# Patient Record
Sex: Male | Born: 1965 | Race: White | Hispanic: No | Marital: Married | Smoking: Never smoker
Health system: Southern US, Community
[De-identification: ages and names within clinical notes are randomized; demographics above are authoritative.]

## PROBLEM LIST (undated history)

## (undated) HISTORY — PX: CHOLECYSTECTOMY: SHX55

---

## 2018-08-01 ENCOUNTER — Emergency Department (HOSPITAL_BASED_OUTPATIENT_CLINIC_OR_DEPARTMENT_OTHER): Payer: Worker's Compensation

## 2018-08-01 ENCOUNTER — Emergency Department (HOSPITAL_BASED_OUTPATIENT_CLINIC_OR_DEPARTMENT_OTHER)
Admission: EM | Admit: 2018-08-01 | Discharge: 2018-08-01 | Disposition: A | Discharge status: Home / Self Care | Payer: Worker's Compensation | Attending: Emergency Medicine | Admitting: Emergency Medicine

## 2018-08-01 ENCOUNTER — Other Ambulatory Visit: Payer: Self-pay

## 2018-08-01 ENCOUNTER — Encounter (HOSPITAL_BASED_OUTPATIENT_CLINIC_OR_DEPARTMENT_OTHER): Payer: Self-pay

## 2018-08-01 DIAGNOSIS — I10 Essential (primary) hypertension: Secondary | ICD-10-CM | POA: Insufficient documentation

## 2018-08-01 DIAGNOSIS — Y999 Unspecified external cause status: Secondary | ICD-10-CM | POA: Insufficient documentation

## 2018-08-01 DIAGNOSIS — W19XXXA Unspecified fall, initial encounter: Secondary | ICD-10-CM

## 2018-08-01 DIAGNOSIS — Y939 Activity, unspecified: Secondary | ICD-10-CM | POA: Insufficient documentation

## 2018-08-01 DIAGNOSIS — M545 Low back pain, unspecified: Secondary | ICD-10-CM

## 2018-08-01 DIAGNOSIS — K7460 Unspecified cirrhosis of liver: Secondary | ICD-10-CM | POA: Diagnosis not present

## 2018-08-01 DIAGNOSIS — S20212A Contusion of left front wall of thorax, initial encounter: Secondary | ICD-10-CM

## 2018-08-01 DIAGNOSIS — S299XXA Unspecified injury of thorax, initial encounter: Secondary | ICD-10-CM | POA: Diagnosis present

## 2018-08-01 DIAGNOSIS — W01190A Fall on same level from slipping, tripping and stumbling with subsequent striking against furniture, initial encounter: Secondary | ICD-10-CM | POA: Insufficient documentation

## 2018-08-01 DIAGNOSIS — Z79899 Other long term (current) drug therapy: Secondary | ICD-10-CM | POA: Insufficient documentation

## 2018-08-01 DIAGNOSIS — Y929 Unspecified place or not applicable: Secondary | ICD-10-CM | POA: Diagnosis not present

## 2018-08-01 LAB — CBC WITH DIFFERENTIAL/PLATELET
Abs Immature Granulocytes: 0.05 10*3/uL (ref 0.00–0.07)
Basophils Absolute: 0.1 10*3/uL (ref 0.0–0.1)
Basophils Relative: 1 %
EOS PCT: 4 %
Eosinophils Absolute: 0.3 10*3/uL (ref 0.0–0.5)
HCT: 46.9 % (ref 39.0–52.0)
Hemoglobin: 15.1 g/dL (ref 13.0–17.0)
Immature Granulocytes: 1 %
Lymphocytes Relative: 33 %
Lymphs Abs: 2.3 10*3/uL (ref 0.7–4.0)
MCH: 31.9 pg (ref 26.0–34.0)
MCHC: 32.2 g/dL (ref 30.0–36.0)
MCV: 99.2 fL (ref 80.0–100.0)
MONO ABS: 0.8 10*3/uL (ref 0.1–1.0)
MONOS PCT: 12 %
Neutro Abs: 3.6 10*3/uL (ref 1.7–7.7)
Neutrophils Relative %: 49 %
Platelets: 204 10*3/uL (ref 150–400)
RBC: 4.73 MIL/uL (ref 4.22–5.81)
RDW: 12.6 % (ref 11.5–15.5)
WBC: 7.1 10*3/uL (ref 4.0–10.5)
nRBC: 0 % (ref 0.0–0.2)

## 2018-08-01 LAB — COMPREHENSIVE METABOLIC PANEL
ALT: 49 U/L — ABNORMAL HIGH (ref 0–44)
AST: 82 U/L — ABNORMAL HIGH (ref 15–41)
Albumin: 4 g/dL (ref 3.5–5.0)
Alkaline Phosphatase: 116 U/L (ref 38–126)
Anion gap: 9 (ref 5–15)
BUN: 13 mg/dL (ref 6–20)
CO2: 26 mmol/L (ref 22–32)
CREATININE: 1.02 mg/dL (ref 0.61–1.24)
Calcium: 9.4 mg/dL (ref 8.9–10.3)
Chloride: 105 mmol/L (ref 98–111)
GFR calc Af Amer: >60 mL/min (ref >60)
GFR calc non Af Amer: >60 mL/min (ref >60)
Glucose, Bld: 169 mg/dL — ABNORMAL HIGH (ref 70–99)
Potassium: 3.7 mmol/L (ref 3.5–5.1)
Sodium: 140 mmol/L (ref 135–145)
Total Bilirubin: 1 mg/dL (ref 0.3–1.2)
Total Protein: 7.9 g/dL (ref 6.5–8.1)

## 2018-08-01 MED ORDER — HYDRALAZINE HCL 20 MG/ML IJ SOLN
5.0000 mg | Freq: Once | INTRAMUSCULAR | Status: AC
  Administered 2018-08-01: 5 mg via INTRAVENOUS
  Filled 2018-08-01: qty 1

## 2018-08-01 MED ORDER — CYCLOBENZAPRINE HCL 10 MG PO TABS: 10.0000 mg | ORAL_TABLET | Freq: Two times a day (BID) | ORAL | 0 refills | Status: AC | PRN

## 2018-08-01 MED ORDER — IOPAMIDOL (ISOVUE-300) INJECTION 61%
100.0000 mL | Freq: Once | INTRAVENOUS | Status: AC | PRN
  Administered 2018-08-01: 100 mL via INTRAVENOUS

## 2018-08-01 MED ORDER — ACETAMINOPHEN 500 MG PO TABS
1000.0000 mg | ORAL_TABLET | Freq: Once | ORAL | Status: AC
  Administered 2018-08-01: 1000 mg via ORAL
  Filled 2018-08-01: qty 2

## 2018-08-01 MED ORDER — HYDROCHLOROTHIAZIDE 25 MG PO TABS: 25.0000 mg | ORAL_TABLET | Freq: Every day | ORAL | 0 refills | Status: AC

## 2018-08-01 NOTE — ED Notes (Signed)
ED Provider at bedside. 

## 2018-08-01 NOTE — ED Notes (Signed)
PT states understanding of care given, follow up care, and medication prescribed. PT ambulated from ED to car with a steady gait. 

## 2018-08-01 NOTE — ED Triage Notes (Signed)
Pt was on ladder at work yesterday- got caught and fell. Pt reports back pain- was seen at urgent care and sent here. Pt hypertensive - does not take medication.

## 2018-08-01 NOTE — ED Provider Notes (Signed)
[facility] EMERGENCY DEPARTMENT Provider Note   CSN: [id number] Arrival date & time: 08/01/18  1709     History   Chief Complaint Chief Complaint  Patient presents with  . Fall    HPI Yuepheng Schleeter is a 53 y.o. male.  HPI   2-3 steps from the top of 10 foot ladder, was reaching for something then twisting, and fell to the side. back hit the metal shelving unit and leg twisted into ladder ring, shelving unit had a metal piece that is raised up, leg was twisted into ladder step and that kept him from falling all of the way. Essentially fall from standing. Occurred 230PM yesterday.  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  Left knee with pain with walking. Lower and thoracic back.  Can feel it when trying to lean over to pick up, twist, or open door.  Severe pain.  No head trauma, no LOC, no headaches or neck pain. No numbness or weakness. Toes since yesterday tingling.  Started to get rash thinks from stress on arms.  No urinary or stool symptoms.    No abdominal pain or chest pain.   Flank pain with deep breaths RUQ and flank pain    Hx of htn, off of medication since last year  History reviewed. No pertinent past medical history.  There are no active problems to display for this patient.   Past Surgical History:  Procedure Laterality Date  . CHOLECYSTECTOMY          Home Medications    Prior to Admission medications   Medication Sig Start Date End Date Taking? Authorizing Provider  cyclobenzaprine (FLEXERIL) 10 MG tablet Take 1 tablet (10 mg total) by mouth 2 (two) times daily as needed for muscle spasms. 08/01/18   Alvira Monday, MD  hydrochlorothiazide (HYDRODIURIL) 25 MG tablet Take 1 tablet (25 mg total) by mouth daily. 08/01/18   Alvira Monday, MD    Family History No family history on file.  Social History Social History   Tobacco Use  . Smoking status: Never Smoker  . Smokeless tobacco: Never Used  Substance  Use Topics  . Alcohol use: Yes  . Drug use: Never     Allergies   Penicillins   Review of Systems Review of Systems  Constitutional: Negative for fever.  HENT: Negative for sore throat.   Eyes: Negative for visual disturbance.  Respiratory: Negative for shortness of breath.   Cardiovascular: Negative for chest pain.  Gastrointestinal: Positive for abdominal pain. Negative for constipation, diarrhea, nausea and vomiting.  Genitourinary: Positive for flank pain. Negative for difficulty urinating and dysuria.  Musculoskeletal: Positive for arthralgias and back pain. Negative for neck stiffness.  Skin: Negative for rash.  Neurological: Negative for syncope, facial asymmetry, weakness, numbness and headaches.     Physical Exam Updated Vital Signs BP (!) 166/100 (BP Location: Right Arm) Comment: Simultaneous filing. User may not have seen previous data.  Pulse 90 Comment: Simultaneous filing. User may not have seen previous data.  Temp 98.1 F (36.7 C) (Oral)   Resp 20   SpO2 96% Comment: Simultaneous filing. User may not have seen previous data.  Physical Exam Vitals signs and nursing note reviewed.  Constitutional:      General: He is not in acute distress.    Appearance: He is well-developed. He is not diaphoretic.  HENT:     Head: Normocephalic and atraumatic.  Eyes:     Conjunctiva/sclera: Conjunctivae normal.  Neck:     Musculoskeletal: Normal range of motion.  Cardiovascular:     Rate and Rhythm: Normal rate and regular rhythm.     Heart sounds: Normal heart sounds. No murmur. No friction rub. No gallop.   Pulmonary:     Effort: Pulmonary effort is normal. No respiratory distress.     Breath sounds: Normal breath sounds. No wheezing or rales.  Chest:     Chest wall: Tenderness present.  Abdominal:     General: There is no distension.     Palpations: Abdomen is soft.     Tenderness: There is abdominal tenderness. There is no guarding.  Musculoskeletal:      Lumbar back: He exhibits bony tenderness.  Skin:    General: Skin is warm and dry.  Neurological:     Mental Status: He is alert and oriented to person, place, and time.     GCS: GCS eye subscore is 4. GCS verbal subscore is 5. GCS motor subscore is 6.     Sensory: Sensation is intact.     Motor: Motor function is intact.      ED Treatments / Results  Labs (all labs ordered are listed, but only abnormal results are displayed) Labs Reviewed  COMPREHENSIVE METABOLIC PANEL - Abnormal; Notable for the following components:      Result Value   Glucose, Bld 169 (*)    AST 82 (*)    ALT 49 (*)    All other components within normal limits  CBC WITH DIFFERENTIAL/PLATELET    EKG None  Radiology Dg Chest 2 View  Result Date: 08/01/2018 CLINICAL DATA:  Back pain after fall from ladder EXAM: CHEST - 2 VIEW COMPARISON:  08/01/2018 thoracic spine radiograph. FINDINGS: The heart size and mediastinal contours are within normal limits. Both lungs are clear. The visualized skeletal structures are unremarkable. IMPRESSION: Normal chest. Electronically Signed   By: Deatra RobinsonKevin  Herman M.D.   On: 08/01/2018 22:02   Dg Thoracic Spine 2 View  Result Date: 08/01/2018 CLINICAL DATA:  Fall, right upper back pain and right flank pain EXAM: THORACIC SPINE 2 VIEWS COMPARISON:  None. FINDINGS: There is no evidence of thoracic spine fracture. Alignment is normal. No other significant bone abnormalities are identified. IMPRESSION: Negative. Electronically Signed   By: Charlett NoseKevin  Dover M.D.   On: 08/01/2018 21:04   Dg Knee 2 Views Left  Result Date: 08/01/2018 CLINICAL DATA:  Injury at work with left knee pain. EXAM: LEFT KNEE - 1-2 VIEW COMPARISON:  None. FINDINGS: No evidence of fracture, dislocation, or joint effusion. No evidence of arthropathy or other focal bone abnormality. Soft tissues are unremarkable. IMPRESSION: Negative. Electronically Signed   By: Sherian ReinWei-Chen  Lin M.D.   On: 08/01/2018 21:05   Ct Abdomen  Pelvis W Contrast  Result Date: 08/01/2018 CLINICAL DATA:  Fall from ladder several hours ago with flank pain, initial encounter EXAM: CT ABDOMEN AND PELVIS WITH CONTRAST TECHNIQUE: Multidetector CT imaging of the abdomen and pelvis was performed using the standard protocol following bolus administration of intravenous contrast. CONTRAST:  100mL ISOVUE-300 IOPAMIDOL (ISOVUE-300) INJECTION 61% COMPARISON:  08/03/2017 FINDINGS: Lower chest: No acute abnormality. Hepatobiliary: Mild decreased density is noted throughout the liver with mild nodularity consistent with underlying cirrhotic change. No focal mass is seen. The caudate lobe is hypertrophied. The gallbladder has been surgically removed. Pancreas: Unremarkable. No pancreatic ductal dilatation or surrounding inflammatory changes. Spleen: Normal in size without focal abnormality. Adrenals/Urinary Tract: Adrenal glands are within normal limits bilaterally. Kidneys  are well visualized bilaterally and demonstrate small nonobstructing renal calculi. Extrarenal pelves are seen bilaterally without evidence of ureteral stones or obstructive change. The bladder is partially distended. Stomach/Bowel: Stomach is within normal limits. Appendix appears normal. No evidence of bowel wall thickening, distention, or inflammatory changes. Vascular/Lymphatic: No significant vascular findings are present. No enlarged abdominal or pelvic lymph nodes. Reproductive: Prostate is unremarkable. Other: No abdominal wall hernia or abnormality. No abdominopelvic ascites. Musculoskeletal: No acute or significant osseous findings. IMPRESSION: No acute abnormality related to the recent fall is noted. Cirrhotic change of the liver with hypertrophy of the caudate lobe. Nonobstructing renal calculi bilaterally. Electronically Signed   By: Alcide Clever M.D.   On: 08/01/2018 22:22    Procedures Procedures (including critical care time)  Medications Ordered in ED Medications  acetaminophen  (TYLENOL) tablet 1,000 mg (1,000 mg Oral Given 08/01/18 2140)  hydrALAZINE (APRESOLINE) injection 5 mg (5 mg Intravenous Given 08/01/18 2143)  iopamidol (ISOVUE-300) 61 % injection 100 mL (100 mLs Intravenous Contrast Given 08/01/18 2158)     Initial Impression / Assessment and Plan / ED Course  I have reviewed the triage vital signs and the nursing notes.  Pertinent labs & imaging results that were available during my care of the patient were reviewed by me and considered in my medical decision making (see chart for details).     53yo male presents with concern for left flank pain, abdominal pain and back pain after a fall yesterday while on ladder onto raised shelving unit.  XR without pneumothorax. Discussed possibility of occult rib fracture, although overall suspect contusion more likely and recommend spirometry.  CT shows no sign of lumbar fracture, no sign of intraabdominal injury.  Cirrhosis noted and discussed with patient.  Noted to be hypertensive, not currently on medications. No hx to suggest htn emergency. Placed on BP meds and recommend PCP follow up. Patient discharged in stable condition with understanding of reasons to return.   Final Clinical Impressions(s) / ED Diagnoses   Final diagnoses:  Essential hypertension  Fall, initial encounter  Acute low back pain without sciatica, unspecified back pain laterality  Contusion of rib on left side, initial encounter  Cirrhosis of liver without ascites, unspecified hepatic cirrhosis type [facility])    ED Discharge Orders         Ordered    hydrochlorothiazide (HYDRODIURIL) 25 MG tablet  Daily     08/01/18 2235    cyclobenzaprine (FLEXERIL) 10 MG tablet  2 times daily PRN     08/01/18 2235           Alvira Monday, MD 08/03/18 1121

## 2019-07-22 IMAGING — CT CT ABD-PELV W/ CM
2 of 5 series · 16 of 46 positions shown, 18 images · IV contrast (iopamidol)
Comparison: 08/03/2017

CLINICAL DATA: Fall from ladder several hours ago with flank pain,
initial encounter

EXAM:
CT ABDOMEN AND PELVIS WITH CONTRAST
TECHNIQUE: Multidetector CT imaging of the abdomen and pelvis was performed
using the standard protocol following bolus administration of
intravenous contrast.
CONTRAST:  100mL [id number] IOPAMIDOL ([id number]) INJECTION 61%

[Series 2: axial st · axial · 0.89mm/px · z∈[-482,-12]mm · 13 of 108 slices shown, 15 images]
[im 7/108  soft-tissue]
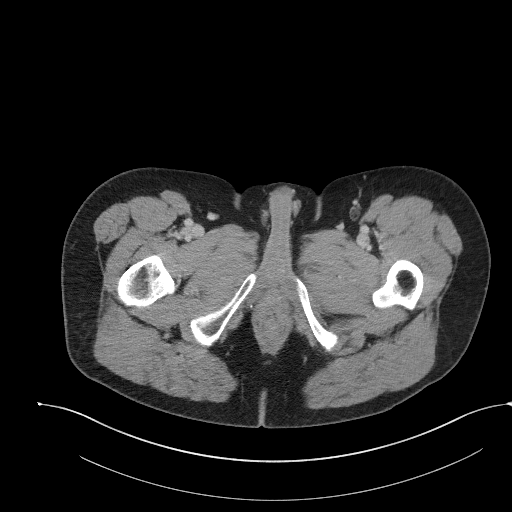
[im 7/108  bone]
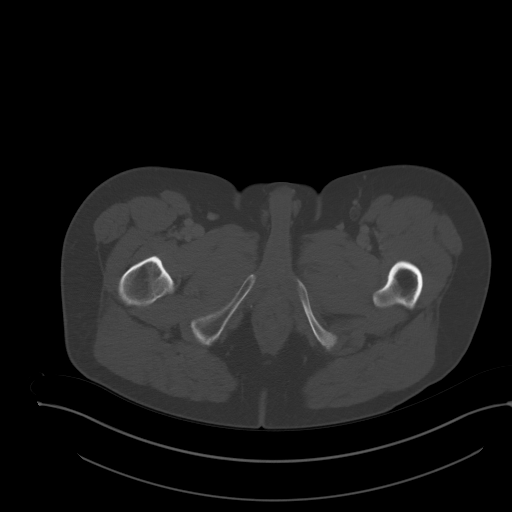
[im 13/108  soft-tissue]
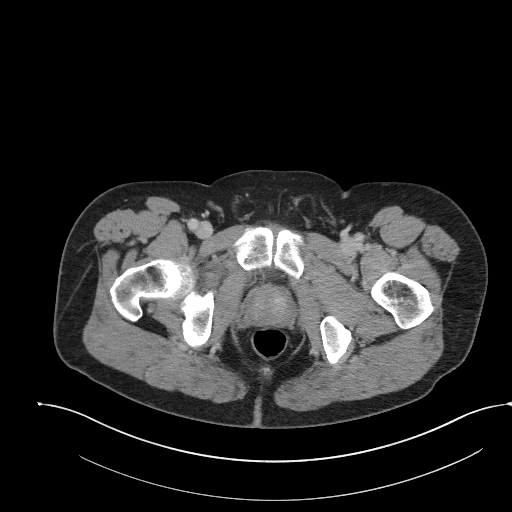
[im 26/108  soft-tissue]
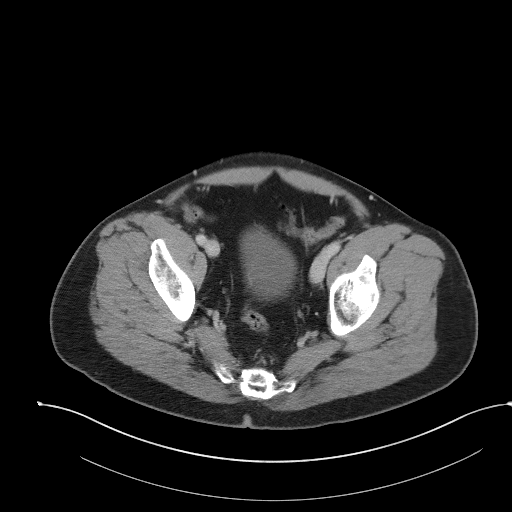
[im 32/108  soft-tissue]
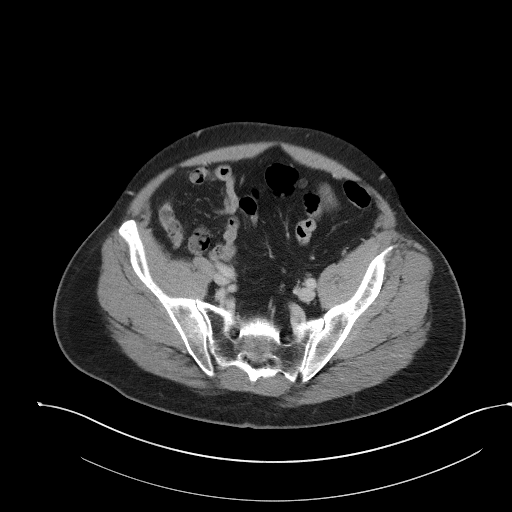
[im 38/108  soft-tissue]
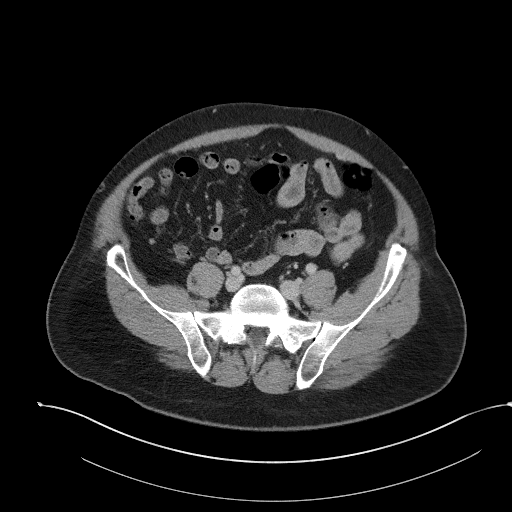
[im 45/108  soft-tissue]
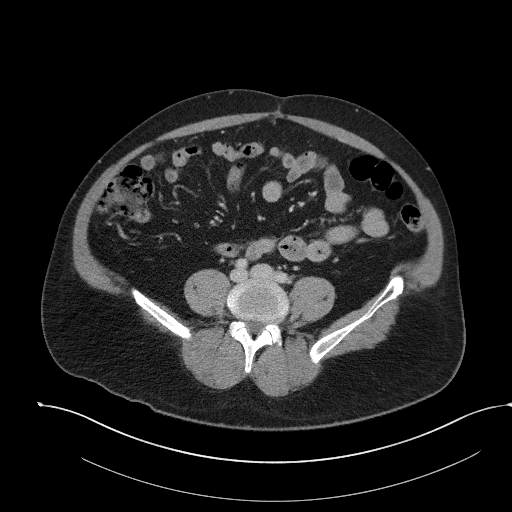
[im 57/108  soft-tissue]
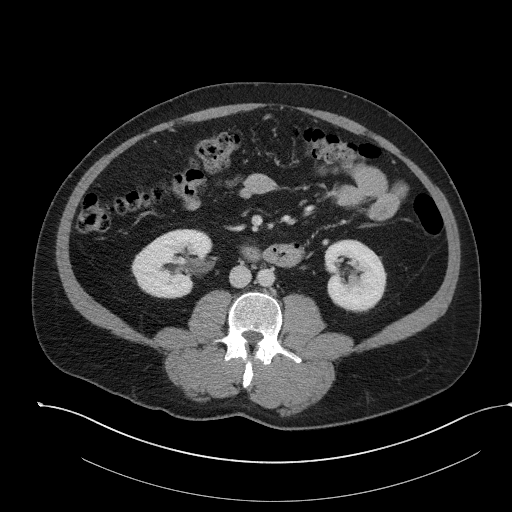
[im 63/108  soft-tissue]
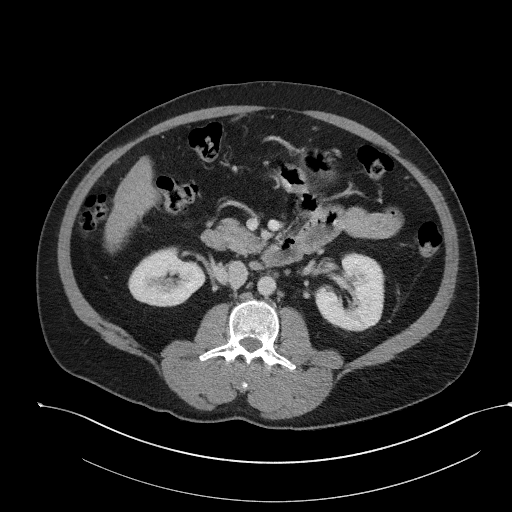
[im 70/108  soft-tissue]
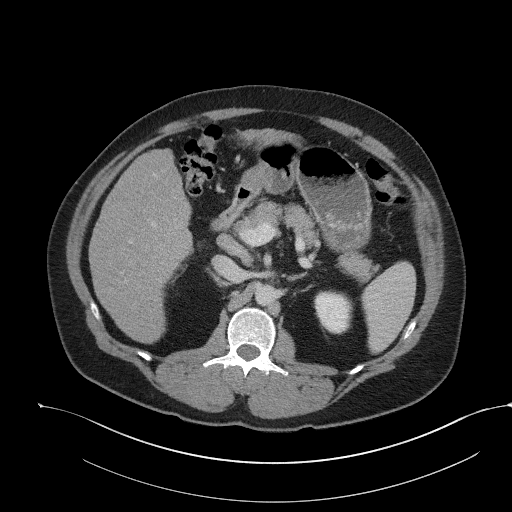
[im 70/108  bone]
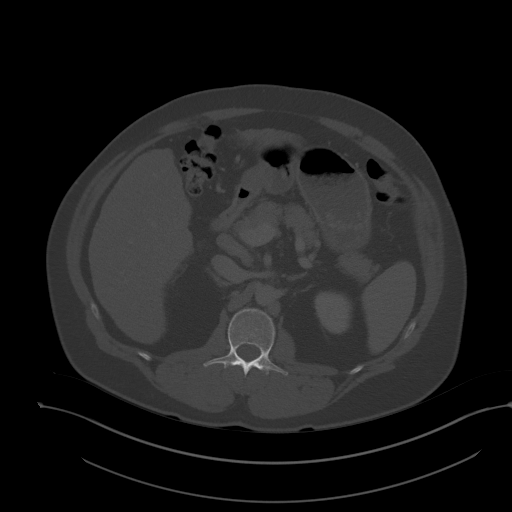
[im 76/108  soft-tissue]
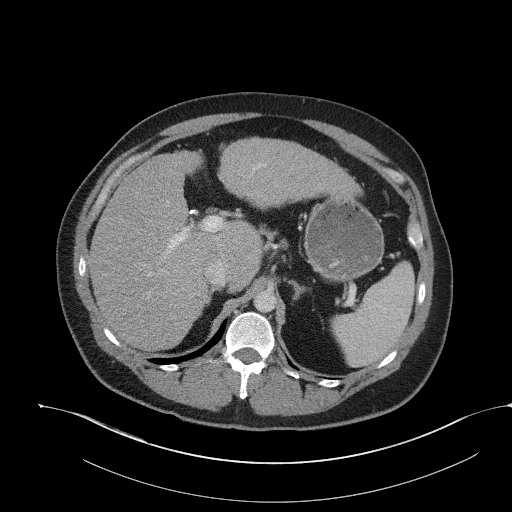
[im 82/108  soft-tissue]
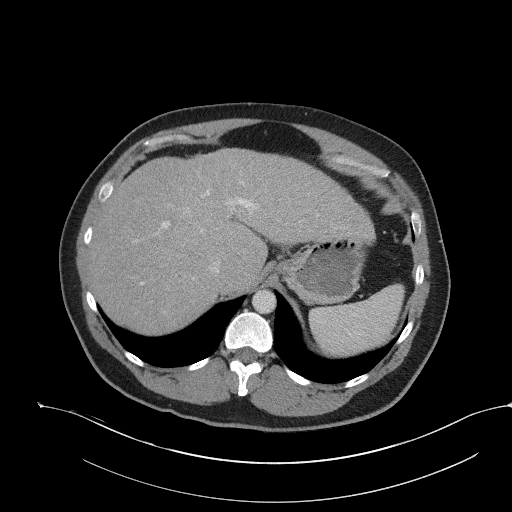
[im 95/108  soft-tissue]
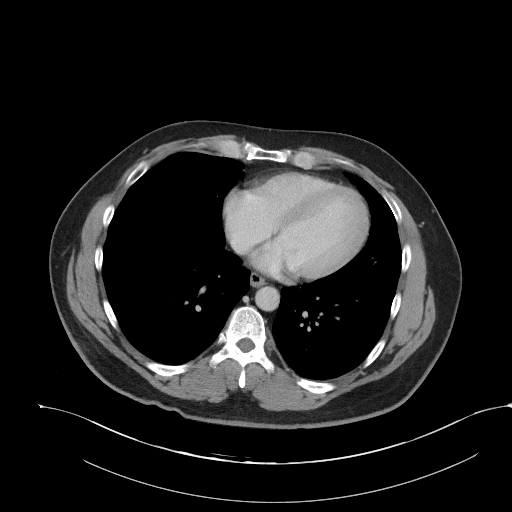
[im 101/108  soft-tissue]
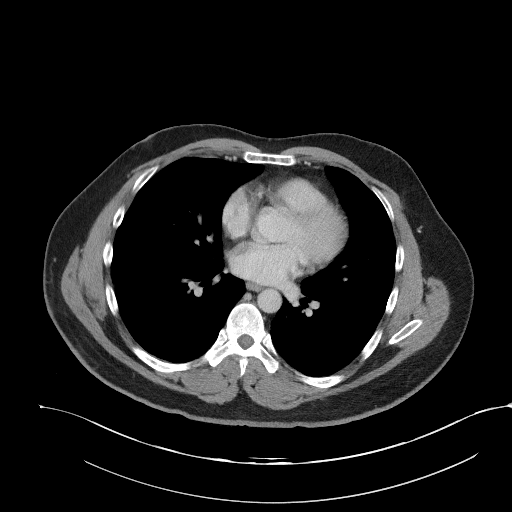

[Series 5: coronal st · coronal · 0.81mm/px · 3 of 97 slices shown]
[im 33/97  soft-tissue]
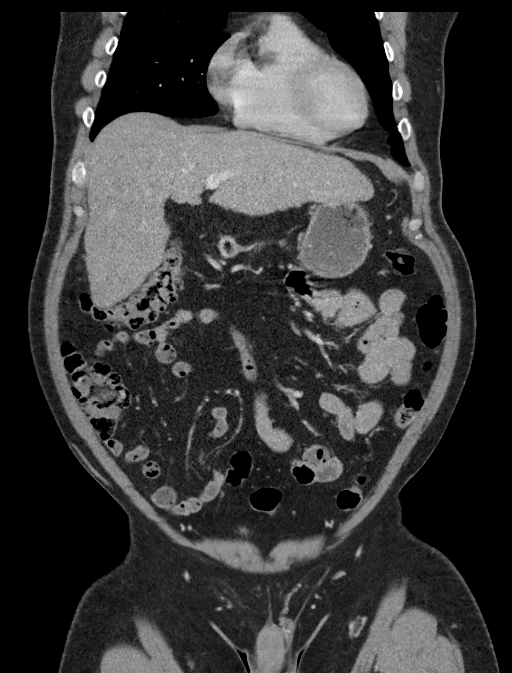
[im 43/97  soft-tissue]
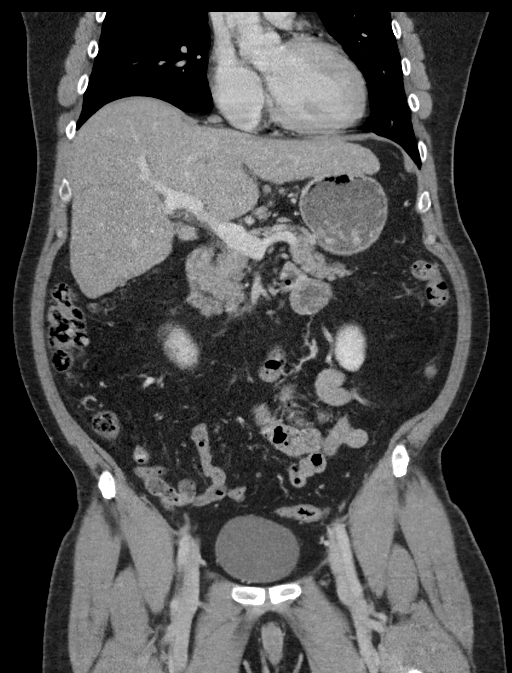
[im 54/97  soft-tissue]
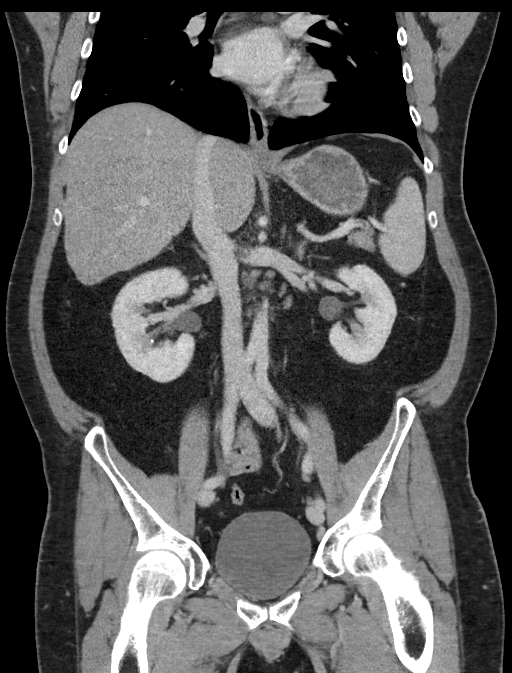

[16 of 46 positions shown; findings below may reference images not displayed]

FINDINGS: Lower chest: No acute abnormality.

Hepatobiliary: Mild decreased density is noted throughout the liver
with mild nodularity consistent with underlying cirrhotic change. No
focal mass is seen. The caudate lobe is hypertrophied. The
gallbladder has been surgically removed.

Pancreas: Unremarkable. No pancreatic ductal dilatation or
surrounding inflammatory changes.

Spleen: Normal in size without focal abnormality.

Adrenals/Urinary Tract: Adrenal glands are within normal limits
bilaterally. Kidneys are well visualized bilaterally and demonstrate
small nonobstructing renal calculi. Extrarenal pelves are seen
bilaterally without evidence of ureteral stones or obstructive
change. The bladder is partially distended.

Stomach/Bowel: Stomach is within normal limits. Appendix appears
normal. No evidence of bowel wall thickening, distention, or
inflammatory changes.

Vascular/Lymphatic: No significant vascular findings are present. No
enlarged abdominal or pelvic lymph nodes.

Reproductive: Prostate is unremarkable.

Other: No abdominal wall hernia or abnormality. No abdominopelvic
ascites.

Musculoskeletal: No acute or significant osseous findings.
IMPRESSION: No acute abnormality related to the recent fall is noted.

Cirrhotic change of the liver with hypertrophy of the caudate lobe.

Nonobstructing renal calculi bilaterally.
